# Patient Record
Sex: Male | Born: 1960 | Race: Black or African American | Hispanic: No | Marital: Single
Health system: Midwestern US, Community
[De-identification: ages and names within clinical notes are randomized; demographics above are authoritative.]

## PROBLEM LIST (undated history)

## (undated) DIAGNOSIS — I10 Essential (primary) hypertension: Secondary | ICD-10-CM

## (undated) DIAGNOSIS — E119 Type 2 diabetes mellitus without complications: Secondary | ICD-10-CM

## (undated) DIAGNOSIS — E7800 Pure hypercholesterolemia, unspecified: Secondary | ICD-10-CM

## (undated) DIAGNOSIS — J45909 Unspecified asthma, uncomplicated: Secondary | ICD-10-CM

---

## 2018-04-20 DIAGNOSIS — F329 Major depressive disorder, single episode, unspecified: Secondary | ICD-10-CM

## 2018-04-20 NOTE — Progress Notes (Signed)
 Pt awaiting discharge. Pt reported feeling frustrated and disappointed that in-patient care not available at this time.     Encouraged pt be diligent in returning for out-patient mental health care. Chaplain's affirming the challenges of making one's way in the current city scene appeared to be helpful to patient: he was abel to smile ruefully. Pt agreed with chaplain that he does have coping skills and functional capacities with which to dealt with life challenges.     Spiritual Care Assessment/Progress Notes    Claris Guymon [id number]  [id number]    1961-01-17  57 y.o.  male    Patient Telephone Number: [phone] (home)   Religious Affiliation: Sherlean   Language: Isadora   Extended Emergency Contact Information  Primary Emergency Contact: Hollon, robert  Address: [address] [place], [place] [address] [place]  Home Phone: [phone]  Relation: Brother   There are no active problems to display for this patient.       Date: 04/21/2018       Level of Religious/Spiritual Activity:  []          Involved in faith tradition/spiritual practice    []          Not involved in faith tradition/spiritual practice  []          Spiritually oriented    []          Claims no spiritual orientation    []          seeking spiritual identity  []          Feels alienated from religious practice/tradition  []          Feels angry about religious practice/tradition  [x]          Spirituality/religious tradition IS a Counsellor for coping at this time.  []          Not able to assess due to medical condition    Services Provided Today:  []          crisis intervention    []          reading Scriptures  []          spiritual assessment    []          prayer  [x]          empathic listening/emotional support  []          rites and rituals (cite in comments)  []          life review     [x]          religious support  []          theological development   []          advocacy  []          ethical dialog     []           blessing  []          bereavement support    []          support to family  []          anticipatory grief support   []          help with AMD  []          spiritual guidance    []          meditation      Spiritual Care Needs  [x]          Emotional Support  [  x]         Spiritual/Religious Care  []          Loss/Adjustment  []          Advocacy/Referral                /Ethics  []          No needs expressed at               this time  []          Other: (note in               comments)  Spiritual Care Plan  []          Follow up visits with               pt/family  []          Provide materials  []          Schedule sacraments  []          Contact Community               Clergy  [x]          Follow up as needed  []          Other: (note in               comments)     Comments:     Chaplain Jenkins Matilde Lemming, PhD  Spiritual Care Department  Phone: [phone]  Pager: [phone]

## 2018-04-20 NOTE — Progress Notes (Signed)
Aaron Estrada is a 57 yo male who called the ambo to bring him to the ED due to depression. He stated he has a history of depression that he was being treated for while incarcerated for 10 years. He was released released in August and has not received OPMH services. He has been prescribed prozac by his medical doctor since he was taking prozac while in prison. The patient stated he has been depressed 3-4 days because he is having a hard time adjusting to the outside since being released from prison. He is living with his cousin, unemployed, has a 8th grade education, no military history and has had no arrest in the past 30 days. He has had no drug abuse treatment but has a history of drug use. He used cocaine, a dime 2 days ago for the first time in 10 years.     On the MSE the patient assessed as calm, stated he has been depressed for 3-4 days. He had appropriate affect, he has not been sleeping for the past month, he has been up for the past 1.5 days. His energy level is down,he feels helpless, he was attentive, he stated he has been seeing shadows at the windows behind the blinds when he is watching television. The patient was alert and oriented, he had normal speech, cooperative behavior, he denied feeling suicidal or homicidal and he also denied a history of both. He has no access to weapons.    Axis 1.........Marland KitchenUnspecified Depressive Disorder..,..F32.9, Cocaine Abuse-Moderate...F14.20    Axis 2.Marland KitchenMarland KitchenMarland KitchenDeferred    Axis 3....Marland KitchenMarland KitchenNone Acute    Axis 4......Marland KitchenUnemployed, Social/Environmental Issues, Needed OPMH follow-up    Telephone call with Dr. Marcelle Overlie who recommended the patient be discharge to home and return on Thursday coming or the following Tuesday for [facility] by 8am at [facility]. The patient doesn't meet the criteria for inpatient services.

## 2018-04-20 NOTE — ED Provider Notes (Signed)
Aaron Estrada is a 57 y.o. male with a h/o depression who presents to the ED with complaint of worsening depression since he was released from [place] on 02/05/2018. He states that he was diagnosed with depression in jail and he has not seen a psychiatrist since his release. He feels that the reason for his depression worsening is because of social stressors but he is unwilling to discuss these stressors. He drinks occasionally and did recently use cocaine. He is not homeless.  He denies SI/HI and AH/VH.     The history is provided by the patient.   Depression    This is a chronic problem. The current episode started more than 1 week ago. The problem has been gradually worsening. Pertinent negatives include no weakness, no hallucinations and no numbness. His past medical history is significant for depression.        No past medical history on file.    No past surgical history on file.      No family history on file.    Social History     Socioeconomic History   ??? Marital status: SINGLE     Spouse name: Not on file   ??? Number of children: Not on file   ??? Years of education: Not on file   ??? Highest education level: Not on file   Occupational History   ??? Not on file   Social Needs   ??? Financial resource strain: Not on file   ??? Food insecurity:     Worry: Not on file     Inability: Not on file   ??? Transportation needs:     Medical: Not on file     Non-medical: Not on file   Tobacco Use   ??? Smoking status: Not on file   Substance and Sexual Activity   ??? Alcohol use: Not on file   ??? Drug use: Not on file   ??? Sexual activity: Not on file   Lifestyle   ??? Physical activity:     Days per week: Not on file     Minutes per session: Not on file   ??? Stress: Not on file   Relationships   ??? Social connections:     Talks on phone: Not on file     Gets together: Not on file     Attends religious service: Not on file     Active member of club or organization: Not on file     Attends meetings of clubs or organizations: Not on file      Relationship status: Not on file   ??? Intimate partner violence:     Fear of current or ex partner: Not on file     Emotionally abused: Not on file     Physically abused: Not on file     Forced sexual activity: Not on file   Other Topics Concern   ??? Not on file   Social History Narrative   ??? Not on file         ALLERGIES: Patient has no known allergies.    Review of Systems   Constitutional: Positive for fever ("few days ago"). Negative for appetite change.   HENT: Negative for rhinorrhea, sinus pain and sore throat.    Respiratory: Negative for cough and shortness of breath.    Cardiovascular: Negative for chest pain and leg swelling.   Gastrointestinal: Positive for diarrhea (1 day ago) and nausea (1 day ago). Negative for abdominal pain, constipation and vomiting.  Endocrine: Negative for polyuria.   Genitourinary: Negative for difficulty urinating, dysuria and flank pain.   Musculoskeletal: Positive for arthralgias, back pain (chronic) and joint swelling.        Chronic left knee pain   Skin: Negative for rash and wound.   Neurological: Positive for dizziness (transient this morning. Resolved ). Negative for syncope, weakness, numbness and headaches.   Psychiatric/Behavioral: Positive for depression and dysphoric mood. Negative for hallucinations, sleep disturbance and suicidal ideas.       Vitals:    04/20/18 1936   BP: (!) 163/101   Pulse: 97   Resp: 18   Temp: 98.1 ??F (36.7 ??C)   SpO2: 98%   Weight: 117.9 kg (260 lb)   Height: 6' (1.829 m)            Physical Exam   Constitutional: He is oriented to person, place, and time. He appears well-developed. No distress.   HENT:   Head: Normocephalic and atraumatic.   Mouth/Throat: Oropharynx is clear and moist.   Eyes: Conjunctivae and EOM are normal.   Neck: Normal range of motion. Neck supple.   Cardiovascular: Normal rate, regular rhythm, normal heart sounds and intact distal pulses. Exam reveals no gallop and no friction rub.   No murmur heard.  Pulmonary/Chest:  Effort normal and breath sounds normal. No stridor. No respiratory distress. He has no wheezes. He has no rales. He exhibits no tenderness.   Abdominal: Soft. He exhibits no distension and no mass. There is no tenderness. There is no rebound and no guarding.   Musculoskeletal: Normal range of motion. He exhibits no edema.   Neurological: He is alert and oriented to person, place, and time. No cranial nerve deficit.   Skin: Skin is warm and dry.   Nursing note and vitals reviewed.       MDM  Number of Diagnoses or Management Options  Depressive disorder:   Diagnosis management comments: This is a 57 y.o. male presenting with suicidal ideation. Testing will be done for medical conditions that may need stabilization prior to psychiatric evaluation or may be contributing to the presenting symptoms. Psychiatry will also be consulted.      Plan:   Orders Placed This Encounter      DRUG SCREEN, URINE      BREATHALIZER ETOH LEVEL      FLUoxetine (PROZAC) 40 mg capsule      CONSULT PSYCH SOCIAL WORKER  -----  9:15 PM  Documented by Enzo Montgomery, acting as a scribe for Dr. Janice Coffin, Victorino Dike.    PROVIDER ATTESTATION:  11:09 PM The entirety of this note, signed by me, accurately reflects all works, treatments, procedures, and medical decision making performed by me, Alessandra Bevels Reifel-Saltzberg, MD.      Patient Progress  Patient progress: stable    Diagnostic Studies:    Breathalyzer for EtOH: negative    Lab Data:  Labs Reviewed   DRUG SCREEN, URINE - Abnormal; Notable for the following components:       Result Value    COCAINE POSITIVE (*)     TRICYCLICS POSITIVE (*)     All other components within normal limits     ED Course:   10:01 PM  Spoke with the psych SW and the recommendation is for the patient to be discharged for F/U with outpatient mental health by Thursday or the following Tuesday at 8 AM.

## 2018-04-20 NOTE — ED Notes (Signed)
Breathalyzer performed at bedside for screening purposes only.    Result: 0.000

## 2018-04-20 NOTE — ED Notes (Signed)
 57 y/o male to ED via EMS for c/o depression.  Pt states he was incarcerated x10 years and recently released.  HE is having trouble transitioning and has recently had new life stressors.  Pt has not taken Prozac x4days I just forgot.

## 2018-04-20 NOTE — ED Provider Notes (Signed)
Aaron Estrada is a 57 y.o. male with a h/o depression who presents to the ED with complaint of worsening depression since he was released from [place] on 02/05/2018. He states that he was diagnosed with depression in jail and he has not seen a psychiatrist since his release. He feels that the reason for his depression worsening is because of social stressors but he is unwilling to discuss these stressors. He drinks occasionally and did recently use cocaine. He is not homeless.  He denies SI/HI and AH/VH.     The history is provided by the patient.   Depression    This is a chronic problem. The current episode started more than 1 week ago. The problem has been gradually worsening. Pertinent negatives include no weakness, no hallucinations and no numbness. His past medical history is significant for depression.        No past medical history on file.    No past surgical history on file.      No family history on file.    Social History     Socioeconomic History   ??? Marital status: SINGLE     Spouse name: Not on file   ??? Number of children: Not on file   ??? Years of education: Not on file   ??? Highest education level: Not on file   Occupational History   ??? Not on file   Social Needs   ??? Financial resource strain: Not on file   ??? Food insecurity:     Worry: Not on file     Inability: Not on file   ??? Transportation needs:     Medical: Not on file     Non-medical: Not on file   Tobacco Use   ??? Smoking status: Not on file   Substance and Sexual Activity   ??? Alcohol use: Not on file   ??? Drug use: Not on file   ??? Sexual activity: Not on file   Lifestyle   ??? Physical activity:     Days per week: Not on file     Minutes per session: Not on file   ??? Stress: Not on file   Relationships   ??? Social connections:     Talks on phone: Not on file     Gets together: Not on file     Attends religious service: Not on file     Active member of club or organization: Not on file     Attends meetings of clubs or organizations: Not on file      Relationship status: Not on file   ??? Intimate partner violence:     Fear of current or ex partner: Not on file     Emotionally abused: Not on file     Physically abused: Not on file     Forced sexual activity: Not on file   Other Topics Concern   ??? Not on file   Social History Narrative   ??? Not on file         ALLERGIES: Patient has no known allergies.    Review of Systems   Constitutional: Positive for fever ("few days ago"). Negative for appetite change.   HENT: Negative for rhinorrhea, sinus pain and sore throat.    Respiratory: Negative for cough and shortness of breath.    Cardiovascular: Negative for chest pain and leg swelling.   Gastrointestinal: Positive for diarrhea (1 day ago) and nausea (1 day ago). Negative for abdominal pain, constipation and vomiting.  Endocrine: Negative for polyuria.   Genitourinary: Negative for difficulty urinating, dysuria and flank pain.   Musculoskeletal: Positive for arthralgias, back pain (chronic) and joint swelling.        Chronic left knee pain   Skin: Negative for rash and wound.   Neurological: Positive for dizziness (transient this morning. Resolved ). Negative for syncope, weakness, numbness and headaches.   Psychiatric/Behavioral: Positive for depression and dysphoric mood. Negative for hallucinations, sleep disturbance and suicidal ideas.       Vitals:    04/20/18 1936   BP: (!) 163/101   Pulse: 97   Resp: 18   Temp: 98.1 ??F (36.7 ??C)   SpO2: 98%   Weight: 117.9 kg (260 lb)   Height: 6' (1.829 m)            Physical Exam   Constitutional: He is oriented to person, place, and time. He appears well-developed. No distress.   HENT:   Head: Normocephalic and atraumatic.   Mouth/Throat: Oropharynx is clear and moist.   Eyes: Conjunctivae and EOM are normal.   Neck: Normal range of motion. Neck supple.   Cardiovascular: Normal rate, regular rhythm, normal heart sounds and intact distal pulses. Exam reveals no gallop and no friction rub.   No murmur heard.   Pulmonary/Chest: Effort normal and breath sounds normal. No stridor. No respiratory distress. He has no wheezes. He has no rales. He exhibits no tenderness.   Abdominal: Soft. He exhibits no distension and no mass. There is no tenderness. There is no rebound and no guarding.   Musculoskeletal: Normal range of motion. He exhibits no edema.   Neurological: He is alert and oriented to person, place, and time. No cranial nerve deficit.   Skin: Skin is warm and dry.   Nursing note and vitals reviewed.       MDM  Number of Diagnoses or Management Options  Depressive disorder:   Diagnosis management comments: This is a 56 y.o. male presenting with suicidal ideation. Testing will be done for medical conditions that may need stabilization prior to psychiatric evaluation or may be contributing to the presenting symptoms. Psychiatry will also be consulted.      Plan:   Orders Placed This Encounter      DRUG SCREEN, URINE      BREATHALIZER ETOH LEVEL      FLUoxetine (PROZAC) 40 mg capsule      CONSULT PSYCH SOCIAL WORKER  -----  9:15 PM  Documented by Enzo Montgomery, acting as a scribe for Dr. Janice Coffin, Victorino Dike.    PROVIDER ATTESTATION:  11:09 PM The entirety of this note, signed by me, accurately reflects all works, treatments, procedures, and medical decision making performed by me, Alessandra Bevels Reifel-Saltzberg, MD.      Patient Progress  Patient progress: stable    Diagnostic Studies:    Breathalyzer for EtOH: negative    Lab Data:  Labs Reviewed   DRUG SCREEN, URINE - Abnormal; Notable for the following components:       Result Value    COCAINE POSITIVE (*)     TRICYCLICS POSITIVE (*)     All other components within normal limits     ED Course:   10:01 PM  Spoke with the psych SW and the recommendation is for the patient to be discharged for F/U with outpatient mental health by Thursday or the following Tuesday at 8 AM.

## 2018-04-20 NOTE — Progress Notes (Signed)
Pt awaiting discharge. Pt reported feeling frustrated and disappointed that in-patient care not available at this time.     Encouraged pt be diligent in returning for out-patient mental health care. Chaplain's affirming the challenges of making one's way in the current city scene appeared to be helpful to patient: he was abel to smile ruefully. Pt agreed with chaplain that he does have coping skills and functional capacities with which to dealt with life challenges.     Spiritual Care Assessment/Progress Notes    Aaron Estrada [id number]  [id number]    1960-08-08  57 y.o.  male    Patient Telephone Number: [phone] (home)   Religious Affiliation: Ephriam Knuckles   Language: Len[place] Ponds   Extended Emergency Contact Information  Primary Emergency Contact: Bjorkman, robert  Address: [address], [place] [address] [place]  Home Phone: [phone]  Relation: Brother   There are no active problems to display for this patient.       Date: 04/21/2018       Level of Religious/Spiritual Activity:  []          Involved in faith tradition/spiritual practice    []          Not involved in faith tradition/spiritual practice  []          Spiritually oriented    []          Claims no spiritual orientation    []          seeking spiritual identity  []          Feels alienated from religious practice/tradition  []          Feels angry about religious practice/tradition  [x]          Spirituality/religious tradition IS a Counsellor for coping at this time.  []          Not able to assess due to medical condition    Services Provided Today:  []          crisis intervention    []          reading Scriptures  []          spiritual assessment    []          prayer  [x]          empathic listening/emotional support  []          rites and rituals (cite in comments)  []          life review     [x]          religious support  []          theological development   []          advocacy   []          ethical dialog     []          blessing  []          bereavement support    []          support to family  []          anticipatory grief support   []          help with AMD  []          spiritual guidance    []          meditation      Spiritual Care Needs  [x]          Emotional Support  [  x]         Spiritual/Religious Care  []          Loss/Adjustment  []          Advocacy/Referral                /Ethics  []          No needs expressed at               this time  []          Other: (note in               comments)  Spiritual Care Plan  []          Follow up visits with               pt/family  []          Provide materials  []          Schedule sacraments  []          Contact Community               Clergy  [x]          Follow up as needed  []          Other: (note in               comments)     Comments:     Chaplain Laveda NormanAnn Riggs, MDiv, PhD  Spiritual Care Department  Phone: [phone]   Pager: [phone]

## 2018-04-20 NOTE — Progress Notes (Addendum)
Aaron Estrada is a 56 yo male who called the ambo to bring him to the ED due to depression. He stated he has a history of depression that he was being treated for while incarcerated for 10 years. He was released released in August and has not received OPMH services. He has been prescribed prozac by his medical doctor since he was taking prozac while in prison. The patient stated he has been depressed 3-4 days because he is having a hard time adjusting to the outside since being released from prison. He is living with his cousin, unemployed, has a 8th grade education, no military history and has had no arrest in the past 30 days. He has had no drug abuse treatment but has a history of drug use. He used cocaine, a dime 2 days ago for the first time in 10 years.     On the MSE the patient assessed as calm, stated he has been depressed for 3-4 days. He had appropriate affect, he has not been sleeping for the past month, he has been up for the past 1.5 days. His energy level is down,he feels helpless, he was attentive, he stated he has been seeing shadows at the windows behind the blinds when he is watching television. The patient was alert and oriented, he had normal speech, cooperative behavior, he denied feeling suicidal or homicidal and he also denied a history of both. He has no access to weapons.    Axis 1..........Unspecified Depressive Disorder..,..F32.9, Cocaine Abuse-Moderate...F14.20    Axis 2....Deferred    Axis 3......None Acute    Axis 4.......Unemployed, Social/Environmental Issues, Needed OPMH follow-up    Telephone call with Dr. Koola who recommended the patient be discharge to home and return on Thursday coming or the following Tuesday for [facility] by 8am at [facility]. The patient doesn't meet the criteria for inpatient services.

## 2018-04-20 NOTE — ED Triage Notes (Signed)
56 y/o male to ED via EMS for c/o depression.  Pt states he was incarcerated x10 years and recently released.  HE is having trouble transitioning and has recently had new life stressors.  Pt has not taken Prozac x4days "I just forgot".

## 2018-04-21 ENCOUNTER — Inpatient Hospital Stay
Admit: 2018-04-21 | Discharge: 2018-04-21 | Disposition: A | Discharge status: Home / Self Care | Payer: MEDICAID | Attending: Emergency Medicine

## 2018-04-21 LAB — DRUG SCREEN, URINE
AMPHETAMINES: NEGATIVE
Amphetamine Screen, Urine: NEGATIVE
BARBITURATES: NEGATIVE
BENZODIAZEPINES: NEGATIVE
Barbiturate Screen, Urine: NEGATIVE
Benzodiazepine Screen, Urine: NEGATIVE
Buprenorphine Screen, Urine: NEGATIVE
Buprenorphine screen, urine: NEGATIVE
COCAINE: POSITIVE — AB
Cocaine Screen Urine: POSITIVE — AB
METHADONE: NEGATIVE
Methadone Screen, Urine: NEGATIVE
Methamphetamines: NEGATIVE
Methamphetamines: NEGATIVE
OPIATES: NEGATIVE
OXYCODONE SCREEN: NEGATIVE
Opiate Screen, Urine: NEGATIVE
Oxycodone Screen: NEGATIVE
PCP Screen, Urine: NEGATIVE
PCP(PHENCYCLIDINE): NEGATIVE
PROPOXYPHENE,PPX: NEGATIVE
PROPOXYPHENE: NEGATIVE
THC (TH-CANNABINOL): NEGATIVE
THC Screen, Urine: NEGATIVE
TRICYCLICS,TCAT: POSITIVE — AB
TRICYCLICS: POSITIVE — AB

## 2018-04-21 MED ORDER — IBUPROFEN 600 MG TAB
600 mg | ORAL | Status: DC
Start: 2018-04-21 — End: 2018-04-21

## 2018-04-21 MED FILL — IBUPROFEN 600 MG TAB: 600 mg | ORAL | Qty: 1

## 2018-04-22 ENCOUNTER — Inpatient Hospital Stay: Admit: 2018-04-22 | Payer: MEDICAID | Primary: Family Medicine

## 2018-04-22 DIAGNOSIS — F323 Major depressive disorder, single episode, severe with psychotic features: Secondary | ICD-10-CM

## 2018-04-22 LAB — DRUG SCREEN, URINE
AMPHETAMINES: NEGATIVE
Amphetamine Screen, Urine: NEGATIVE
BARBITURATES: NEGATIVE
BENZODIAZEPINES: NEGATIVE
Barbiturate Screen, Urine: NEGATIVE
Benzodiazepine Screen, Urine: NEGATIVE
Buprenorphine Screen, Urine: NEGATIVE
Buprenorphine screen, urine: NEGATIVE
COCAINE: POSITIVE — AB
Cocaine Screen Urine: POSITIVE — AB
METHADONE: NEGATIVE
Methadone Screen, Urine: NEGATIVE
Methamphetamines: NEGATIVE
Methamphetamines: NEGATIVE
OPIATES: NEGATIVE
OXYCODONE SCREEN: NEGATIVE
Opiate Screen, Urine: NEGATIVE
Oxycodone Screen: NEGATIVE
PCP Screen, Urine: NEGATIVE
PCP(PHENCYCLIDINE): NEGATIVE
PROPOXYPHENE,PPX: NEGATIVE
PROPOXYPHENE: NEGATIVE
THC (TH-CANNABINOL): NEGATIVE
THC Screen, Urine: NEGATIVE
TRICYCLICS,TCAT: POSITIVE — AB
TRICYCLICS: POSITIVE — AB

## 2018-04-22 LAB — COMPREHENSIVE METABOLIC PANEL
ALT: 48 U/L (ref 12–78)
AST: 26 U/L (ref 15–37)
Albumin/Globulin Ratio: 1.1 (ref 1.0–3.1)
Albumin: 4 g/dL (ref 3.4–5.0)
Alkaline Phosphatase: 78 U/L (ref 46–116)
Anion Gap: 14 mmol/L (ref 10–17)
BUN: 15 MG/DL (ref 7–18)
Bun/Cre Ratio: 14 (ref 6.0–20.0)
CO2: 27 mmol/L (ref 21–32)
Calcium: 8.9 MG/DL (ref 8.5–10.1)
Chloride: 104 mmol/L (ref 98–107)
Creatinine: 1.1 MG/DL (ref 0.6–1.3)
EGFR IF NonAfrican American: >60 mL/min/{1.73_m2} (ref >60)
GFR African American: >60 mL/min/{1.73_m2} (ref >60)
Globulin: 3.5 g/dL
Glucose: 91 mg/dL (ref 74–106)
Potassium: 4.1 mmol/L (ref 3.5–5.1)
Sodium: 141 mmol/L (ref 136–145)
Total Bilirubin: 0.9 MG/DL (ref 0.2–1.0)
Total Protein: 7.5 g/dL (ref 6.4–8.2)

## 2018-04-22 LAB — CBC WITH AUTO DIFFERENTIAL
Basophils %: 0 % (ref 0–2)
Basophils Absolute: 0 10*3/uL (ref 0.0–0.2)
Eosinophils %: 2 % (ref 0–5)
Eosinophils Absolute: 0.1 10*3/uL (ref 0.0–0.7)
Hematocrit: 43.2 % (ref 36.8–45.2)
Hemoglobin: 14.2 g/dL (ref 12.8–15.0)
Immature Granulocytes: 0 % (ref 0.0–5.0)
Lymphocytes %: 50 % — ABNORMAL HIGH (ref 16–40)
Lymphocytes Absolute: 2.3 10*3/uL (ref 1.2–3.4)
MCH: 29.2 PG (ref 27–31)
MCHC: 32.9 g/dL (ref 32–36)
MCV: 88.7 FL (ref 81–99)
MPV: 10 FL (ref 7.4–10.4)
Monocytes %: 9 % (ref 0–12)
Monocytes Absolute: 0.4 10*3/uL — ABNORMAL LOW (ref 1.1–3.2)
Neutrophils %: 39 % — ABNORMAL LOW (ref 40–70)
Neutrophils Absolute: 1.8 10*3/uL (ref 1.4–6.5)
Platelets: 206 10*3/uL (ref 140–450)
RBC: 4.87 M/uL (ref 4.0–5.2)
RDW: 15.2 % — ABNORMAL HIGH (ref 11.5–14.5)
WBC: 4.7 10*3/uL — ABNORMAL LOW (ref 4.8–10.8)

## 2018-04-22 LAB — LIPID PANEL
CHOL/HDL Ratio: 4.4
Chol/HDL Ratio: 4.4
Cholesterol, Total: 250 MG/DL — ABNORMAL HIGH (ref <200)
Cholesterol, total: 250 MG/DL — ABNORMAL HIGH (ref <200)
HDL Cholesterol: 57 MG/DL (ref 40–60)
HDL: 57 MG/DL (ref 40–60)
LDL Calculated: 178 MG/DL — ABNORMAL HIGH (ref 0–130)
LDL, calculated: 178 MG/DL — ABNORMAL HIGH (ref 0–130)
LDL/HDL Ratio: 3.1
LDL/HDL Ratio: 3.1
Triglyceride: 75 MG/DL (ref 0–200)
Triglycerides: 75 MG/DL (ref 0–200)
VLDL Cholesterol Calculated: 15 MG/DL
VLDL, calculated: 15 MG/DL

## 2018-04-22 LAB — HEMOGLOBIN A1C W/O EAG
Hemoglobin A1C: 5.5 % (ref 4.5–6.2)
Hemoglobin A1c: 5.5 % (ref 4.5–6.2)

## 2018-04-22 LAB — TSH 3RD GENERATION
TSH: 1.31 u[IU]/mL (ref 0.55–7.7)
TSH: 1.31 u[IU]/mL (ref 0.55–7.7)

## 2018-04-22 LAB — HEPATITIS C ANTIBODY: HCV Ab: NONREACTIVE

## 2018-04-22 LAB — ETHYL ALCOHOL
ALCOHOL(ETHYL),SERUM: <3 MG/DL (ref <3.0)
Ethyl Alcohol: <3 MG/DL (ref <3.0)

## 2018-04-22 LAB — CBC WITH AUTOMATED DIFF
ABS. BASOPHILS: 0 10*3/uL (ref 0.0–0.2)
ABS. EOSINOPHILS: 0.1 10*3/uL (ref 0.0–0.7)
ABS. LYMPHOCYTES: 2.3 10*3/uL (ref 1.2–3.4)
ABS. MONOCYTES: 0.4 10*3/uL — ABNORMAL LOW (ref 1.1–3.2)
ABS. NEUTROPHILS: 1.8 10*3/uL (ref 1.4–6.5)
BASOPHILS: 0 % (ref 0–2)
EOSINOPHILS: 2 % (ref 0–5)
HCT: 43.2 % (ref 36.8–45.2)
HGB: 14.2 g/dL (ref 12.8–15.0)
IMMATURE GRANULOCYTES: 0 % (ref 0.0–5.0)
LYMPHOCYTES: 50 % — ABNORMAL HIGH (ref 16–40)
MCH: 29.2 PG (ref 27–31)
MCHC: 32.9 g/dL (ref 32–36)
MCV: 88.7 FL (ref 81–99)
MONOCYTES: 9 % (ref 0–12)
MPV: 10 FL (ref 7.4–10.4)
NEUTROPHILS: 39 % — ABNORMAL LOW (ref 40–70)
PLATELET: 206 10*3/uL (ref 140–450)
RBC: 4.87 M/uL (ref 4.0–5.2)
RDW: 15.2 % — ABNORMAL HIGH (ref 11.5–14.5)
WBC: 4.7 10*3/uL — ABNORMAL LOW (ref 4.8–10.8)

## 2018-04-22 LAB — METABOLIC PANEL, COMPREHENSIVE
A-G Ratio: 1.1 (ref 1.0–3.1)
ALT (SGPT): 48 U/L (ref 12–78)
AST (SGOT): 26 U/L (ref 15–37)
Albumin: 4 g/dL (ref 3.4–5.0)
Alk. phosphatase: 78 U/L (ref 46–116)
Anion gap: 14 mmol/L (ref 10–17)
BUN/Creatinine ratio: 14 (ref 6.0–20.0)
BUN: 15 MG/DL (ref 7–18)
Bilirubin, total: 0.9 MG/DL (ref 0.2–1.0)
CO2: 27 mmol/L (ref 21–32)
Calcium: 8.9 MG/DL (ref 8.5–10.1)
Chloride: 104 mmol/L (ref 98–107)
Creatinine: 1.1 MG/DL (ref 0.6–1.3)
GFR est AA: >60 mL/min/{1.73_m2} (ref >60)
GFR est non-AA: >60 mL/min/{1.73_m2} (ref >60)
Globulin: 3.5 g/dL
Glucose: 91 mg/dL (ref 74–106)
Potassium: 4.1 mmol/L (ref 3.5–5.1)
Protein, total: 7.5 g/dL (ref 6.4–8.2)
Sodium: 141 mmol/L (ref 136–145)

## 2018-04-22 LAB — HCV AB: Hepatitis C virus Ab: NONREACTIVE
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
## 2018-04-27 LAB — 15-DRUG SCREEN WITH BENZO,UR,W/CONF.
6-Acetylmorphine, urine: NEGATIVE ng/mL
Alprazolam: NEGATIVE
Amphetamine Screen, urine: NEGATIVE ng/mL
Barbiturates Screen, urine: NEGATIVE ng/mL
Benzodiazepines: NEGATIVE ng/mL
Buprenorphine, urine: NEGATIVE ng/mL
Cannabinoid Screen, urine: NEGATIVE ng/mL
Clonazepam: NEGATIVE
Creatinine, urine: 260.6 mg/dL (ref 20.0–300.0)
Ethanol: NEGATIVE mg/dL
Fentanyl Screen, urine: NEGATIVE pg/mL
Flurazepam: NEGATIVE
Lorazepam: NEGATIVE
Meperidine Screen, urine: NEGATIVE ng/mL
Methadone Screen, urine: NEGATIVE ng/mL
Midazolam: NEGATIVE
Nordiazepam: NEGATIVE
Opiate Screen, urine: NEGATIVE ng/mL
Oxazepam: NEGATIVE
Oxycodone/Oxymorphone, urine: NEGATIVE ng/mL
Phencyclidine Screen, urine: NEGATIVE ng/mL
Propoxyphene Screen, urine: NEGATIVE ng/mL
Specific Gravity: 1.019
Temazepam: NEGATIVE
Tramadol Screen, urine: NEGATIVE ng/mL
Triazolam: NEGATIVE
pH, urine: 5.9 (ref 4.5–8.9)

## 2018-04-27 LAB — COCAINE, CONFIRM
Cocaine + Metabolite: POSITIVE — AB
Cocaine confirm: 7400 ng/mL

## 2018-04-30 ENCOUNTER — Encounter: Primary: Family Medicine

## 2018-04-30 ENCOUNTER — Encounter: Attending: Psychiatry | Primary: Family Medicine

## 2018-05-03 ENCOUNTER — Inpatient Hospital Stay: Admit: 2018-05-03 | Payer: MEDICAID | Attending: Psychiatry | Primary: Family Medicine

## 2018-05-03 ENCOUNTER — Inpatient Hospital Stay: Admit: 2018-05-03 | Payer: MEDICAID | Primary: Family Medicine

## 2018-05-03 MED ORDER — FLUOXETINE 40 MG CAP
40 mg | ORAL_CAPSULE | Freq: Every day | ORAL | 1 refills | Status: DC
Start: 2018-05-03 — End: 2018-05-31
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
## 2018-05-03 NOTE — Behavioral Health Treatment Team (Signed)
 [facility]   Nutritional Screening Form    Aaron Estrada  12-04-1960  [id number]    The purpose of a nutritional screen is to identify nutritional problems and risk factors.  Utilizing the below screening assessment questionnaire will determine the need for a nutrition assessment by a registered dietitian.    Evaluation Criteria (check all that apply) Yes No   Weight gain or loss due to psychiatric medication?  No   Miss meals due to financial limitations?  No   Unintended weight loss of more than 10 pounds within the last three months? Yes    Consuming less than 50% of meals for 5 days or more?  No   Increase or decrease in appetite/ food intake for 4-6 weeks? Yes    Eating habits or behaviors that may be indicators of an eating disorder, such as binging or induced vomiting  No   Diarrhea for more than 2 days?  No   Vomiting 5 days or more?  No   Malnourished appearance?  No   Morbid obesity (at or greater than 42 BMI)?  No   Chrohn's or Ulcerative Colitis?  No   GERD?  No   HIV/AIDS  No   Diabetes  No   Food allergies?  No   Dental problem?  No   Special diet?  No   Nutrition Assessment                     No  Client identified to be at nutritional risk based on meeting > 4 criteria above       ___   ___     Comments: patient has an intentional weight loss of > 30 lbs. I am trying to eat better. Patient also c/o intermittent constipation.      Referral to (Name of Facility):  Referral made by:
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
## 2018-05-03 NOTE — Behavioral Health Treatment Team (Signed)
[facility]   Behavioral Health   Nutritional Screening Form    Aaron Estrada  03/08/1961  [id number]    The purpose of a nutritional screen is to identify nutritional problems and risk factors.  Utilizing the below screening assessment questionnaire will determine the need for a nutrition assessment by a registered dietitian.    Evaluation Criteria (check all that apply) Yes No   Weight gain or loss due to psychiatric medication?  No   Miss meals due to financial limitations?  No   Unintended weight loss of more than 10 pounds within the last three months? Yes    Consuming less than 50% of meals for 5 days or more?  No   Increase or decrease in appetite/ food intake for 4-6 weeks? Yes    Eating habits or behaviors that may be indicators of an eating disorder, such as binging or induced vomiting  No   Diarrhea for more than 2 days?  No   Vomiting 5 days or more?  No   Malnourished appearance?  No   Morbid obesity (at or greater than 42 BMI)?  No   Chrohn???s or Ulcerative Colitis?  No   GERD?  No   HIV/AIDS  No   Diabetes  No   Food allergies?  No   Dental problem?  No   Special diet?  No   Nutrition Assessment                     No  Client identified to be at nutritional risk based on meeting > 4 criteria above       ___   ___     Comments: patient has an intentional weight loss of > 30 lbs. "I am trying to eat better." Patient also c/o intermittent constipation.      Referral to (Name of Facility):  Referral made by:
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
## 2018-05-07 ENCOUNTER — Inpatient Hospital Stay: Admit: 2018-05-07 | Payer: MEDICAID | Primary: Family Medicine

## 2018-05-07 NOTE — Behavioral Health Treatment Team (Signed)
 [facility]   Department of [place] Health    Progress Note    Aaron Estrada  05-23-1961  [id number]    DOS: 05/07/18    Time In 1:05pm  Time Out 1:30pm  Length of Service: 25 minutes    Charge Code:  09167  Psychotherapy 30 min  ([id number])    Program:  [facility]    Presenting Problems of Chief Complaint (Including onging, recurrent problems and active complaint for this day):  Pt reported no problems.    Mental Status Examination    General Appearance: Appropriately attired  Orientation to:  Person, Place, Time and Situation  Consciousness:  Clear  General Behavior: Cooperative  Motor Activity:  Normal motor activity  Content of Thought: Unremarkable  Speech Process: Normal  Thought Process: Logical  Mood (emotional state as subjectively described by client): Happy  Affect (emotional tone observed and defined through observation by the interviewer):  Euthymic, congruent  Insight and Judgment:  Good insight and Good judgment    Symptoms and General Findings (Self-report and clinical observations to include changes compared to previous sessions):  Pt reported that he decided to get Tx for SUD. He attends and receives housing through [facility]. He stated that he has been taking psychotropic medication every day. He denied any side effects.    Interventions and Patient's Responses (Include homework): This therapist reviewed psychiatric evaluation with pt. Pt was commended for seeking Tx for SUD.    DSM-5 Diagnosis: F34.1 Persistent Depressive Disorder    Treatment Goals Progress: ITP in progress    Prognosis: Fair    Progress Note Plan:    Modality: Individual Therapy     Frequency: Monthly    Next Appointment: 05/28/18    [name] [facility]  05/11/2018  1:05 PM

## 2018-05-07 NOTE — Behavioral Health Treatment Team (Signed)
[facility]   Department of Behavioral Health    Progress Note    Emanuel Garrelts  03/06/1961  [id number]    DOS: 05/07/18    Time In 1:05pm  Time Out 1:30pm  Length of Service: 25 minutes    Charge Code:  90832  Psychotherapy 30 min  ([id number])    Program:  [facility]    Presenting Problems of Chief Complaint (Including onging, recurrent problems and active complaint for this day):  Pt reported no problems.    Mental Status Examination    General Appearance: Appropriately attired  Orientation to:  Person, Place, Time and Situation  Consciousness:  Clear  General Behavior: Cooperative  Motor Activity:  Normal motor activity  Content of Thought: Unremarkable  Speech Process: Normal  Thought Process: Logical  Mood (emotional state as subjectively described by client): "Happy"  Affect (emotional tone observed and defined through observation by the interviewer):  Euthymic, congruent  Insight and Judgment:  Good insight and Good judgment    Symptoms and General Findings (Self-report and clinical observations to include changes compared to previous sessions):  Pt reported that he decided to get Tx for SUD. He attends and receives housing through [facility]. He stated that he has been taking psychotropic medication every day. He denied any side effects.    Interventions and Patient's Responses (Include homework): This therapist reviewed psychiatric evaluation with pt. Pt was commended for seeking Tx for SUD.    DSM-5 Diagnosis: F34.1 Persistent Depressive Disorder    Treatment Goals Progress: ITP in progress    Prognosis: Fair    Progress Note Plan:    Modality: Individual Therapy     Frequency: Monthly    Next Appointment: 05/28/18    Anays Detore LCPC  05/11/2018  1:05 PM

## 2018-05-28 NOTE — Behavioral Health Treatment Team (Signed)
Missed Appointment Note        05/28/2018      Aaron Estrada  November 30, 1960  [id number]    Service:  [facility]  Missed appointment type:  No show  Reason: Unknown   Outcome: A message was unable to be left for the patient to reschedule the missed appointment due to the patient's phone not functioning/voicemail box being full.        Alford Highland [facility]  05/28/2018

## 2018-05-28 NOTE — Behavioral Health Treatment Team (Signed)
Missed Appointment Note        05/28/2018      Aaron Estrada  08/23/1960  [id number]    Service:  [facility]  Missed appointment type:  No show  Reason: Unknown   Outcome: A message was unable to be left for the patient to reschedule the missed appointment due to the patient's phone not functioning/voicemail box being full.        Dahlila Pfahler LCPC  05/28/2018

## 2018-05-29 ENCOUNTER — Inpatient Hospital Stay: Payer: MEDICAID | Primary: Family Medicine

## 2018-05-31 ENCOUNTER — Inpatient Hospital Stay: Admit: 2018-05-31 | Payer: MEDICAID | Attending: Psychiatry | Primary: Family Medicine

## 2018-05-31 MED ORDER — FLUOXETINE 40 MG CAP
40 mg | ORAL_CAPSULE | Freq: Every day | ORAL | 1 refills | Status: AC
Start: 2018-05-31 — End: ?

## 2018-05-31 NOTE — Progress Notes (Signed)
[facility]  [place]     [place] Note    Name: Aaron Estrada    Account Number:  [id number]   Date: 05/31/2018     Time: 12:39 PM     Session Information   Time In: 1230   Time Out: 1300   Outpatient    DSM 5 Diagnosis     Patient Active Problem List    Diagnosis Date Noted   ??? HTN (hypertension) 05/03/2018   ??? Knee pain 05/03/2018   ??? HLD (hyperlipidemia) 05/03/2018   ??? Persistent depressive disorder 05/03/2018   ??? H/O: substance abuse (HCC) 05/03/2018   ??? Tobacco use disorder 05/03/2018       Dr Benay Pillowontact Information    History of Present Illness: 57yAAM, divorced in 1992, no kids, living in a recovery house for 6 months (then returns home with his cousin), supported by [employer].   -[facility].   -He is on probation until 2023  ??  Pt is on time for his appt.   Pt missed his last therapy appt while at his program, but he'll rescheduled.   He's waiting on his insurance card to see his PCP regarding his knee pain, has appt for Dec 26th.   He's doing fine on Prozac, no adverse effects.     Plan: Cont Prozac 40mg /day for depression anxiety; Cont drug tx program; Cont therapy. Avoid Cocaine and Etoh. Recommend not picking up Tobacco again.    ??  ??  PCP: [facility]     Chief Complaint: f/u     Compliance with Medication: Yes     Pregnant: Not applicable    Mental Status   Reliability in Providing Information: Good  Personal Presentation: Dresses appropriately (beard, glassesm, kufi)  Motor Activity: Unremarkable  Speech Pattern: Normal rate;Normal rhythm  General Attitude: Cooperative  Mood: Euthymic  Eye Contact: Good  Affect: Full  Thought Process: Organized  Thought Content: Unremarkable  Hallucinations: None  Delusions: None  Suicidal Ideation/Attempts: No  Homicidal Ideation/Attempts: No  Orientation Level: Oriented x4  Homicidal Ideation/Attempts: No  Neurologic State: Alert  Attention/Concentration: Attentive  Abstract  Thinking: Intact  Judgment: Good  Insight: Good  Risks: Diminished Function  Strengths and Assets Inventory: Adequate living arrangements;Family Support        Medications      Prior to Admission medications    Medication Sig Start Date End Date Taking? Authorizing Provider   FLUoxetine (PROZAC) 40 mg capsule Take 1 Cap by mouth daily. Indications: Anxiousness associated with Depression 05/31/18  Yes Joette CatchingHauser, David C, MD   aspirin 81 mg chewable tablet Take 81 mg by mouth daily.    Provider, Historical         Med Rec Review   Medication reconciliation previously reviewed. No changes.    Labs   All lab results reviewed.    AIMS  No data recorded    Plan   Treatment Plan        1. Continue current treatment modalities? If no, state rationale and address changes in Treatment Plan under Optional Sections.  Yes        2. Continue current medications? If no, state rationale and address changes in Medications under Optional Sections.  Yes        3. Referrals or Consultations needed?  Specify below and state reason. No        Risk Management  Risk Factors    Suicide: Not Evident    Physical Violence: Not Evident                  Signed By: Joette Catchingavid C Briseyda Fehr, MD  05/31/2018

## 2018-05-31 NOTE — Progress Notes (Signed)
[facility]  Department of [place] Health     Progress Note    Name: Aaron Estrada    Account Number:  [id number]   Date: 05/31/2018     Time: 12:39 PM     Session Information   Time In: 1230   Time Out: 1300   Outpatient    DSM 5 Diagnosis     Patient Active Problem List    Diagnosis Date Noted   ??? HTN (hypertension) 05/03/2018   ??? Knee pain 05/03/2018   ??? HLD (hyperlipidemia) 05/03/2018   ??? Persistent depressive disorder 05/03/2018   ??? H/O: substance abuse (HCC) 05/03/2018   ??? Tobacco use disorder 05/03/2018       Dr Benay Pillow Information    History of Present Illness: 57yAAM, divorced in 1992, no kids, living in a recovery house for 6 months (then returns home with his cousin), supported by [employer].   -Pt attends [facility] drug tx program.   -He is on probation until 2023  ??  Pt is on time for his appt.   Pt missed his last therapy appt while at his program, but he'll rescheduled.   He's waiting on his insurance card to see his PCP regarding his knee pain, has appt for Dec 26th.   He's doing fine on Prozac, no adverse effects.     Plan: Cont Prozac 40mg /day for depression anxiety; Cont drug tx program; Cont therapy. Avoid Cocaine and Etoh. Recommend not picking up Tobacco again.    ??  ??  PCP: [facility]     Chief Complaint: f/u     Compliance with Medication: Yes     Pregnant: Not applicable    Mental Status   Reliability in Providing Information: Good  Personal Presentation: Dresses appropriately (beard, glassesm, kufi)  Motor Activity: Unremarkable  Speech Pattern: Normal rate;Normal rhythm  General Attitude: Cooperative  Mood: Euthymic  Eye Contact: Good  Affect: Full  Thought Process: Organized  Thought Content: Unremarkable  Hallucinations: None  Delusions: None  Suicidal Ideation/Attempts: No  Homicidal Ideation/Attempts: No  Orientation Level: Oriented x4  Homicidal Ideation/Attempts: No  Neurologic State: Alert  Attention/Concentration: Attentive   Abstract Thinking: Intact  Judgment: Good  Insight: Good  Risks: Diminished Function  Strengths and Assets Inventory: Adequate living arrangements;Family Support        Medications      Prior to Admission medications    Medication Sig Start Date End Date Taking? Authorizing Provider   FLUoxetine (PROZAC) 40 mg capsule Take 1 Cap by mouth daily. Indications: Anxiousness associated with Depression 05/31/18  Yes Joette Catching, MD   aspirin 81 mg chewable tablet Take 81 mg by mouth daily.    Provider, Historical         Med Rec Review   Medication reconciliation previously reviewed. No changes.    Labs   All lab results reviewed.    AIMS  No data recorded    Plan   Treatment Plan        1. Continue current treatment modalities? If no, state rationale and address changes in Treatment Plan under Optional Sections.  Yes        2. Continue current medications? If no, state rationale and address changes in Medications under Optional Sections.  Yes        3. Referrals or Consultations needed?  Specify below and state reason. No        Risk Management  Risk Factors    Suicide: Not Evident    Physical Violence: Not Evident                  Signed By: Joette Catchingavid C Briseyda Fehr, MD  05/31/2018

## 2018-07-01 NOTE — Behavioral Health Treatment Team (Signed)
Missed Appointment Note        07/01/2018      Aaron Estrada  04/12/1961  [id number]    Service:  [facility]  Missed appointment type:  No show  Reason: Unknown   Outcome: A message was unable to be left for the patient to reschedule the missed appointment due to the patient's phone not functioning/voicemail box being full.        Alford Highland [facility]  07/01/2018

## 2018-07-01 NOTE — Behavioral Health Treatment Team (Signed)
[facility]  Outpatient Mental Health Clinic  Discharge Summary    Aaron Estrada  1961/06/02    Date of admission: 04/22/18  Date of last visit: 05/31/18  Date of discharge: 11/19/18    Therapist: Alford Highland [facility]  Psychiatrist: Dr. Alben Deeds  Reason for admission and summary of client treatment issues: Hx of depression and recent hallucinations. Pt reported that he had been going through a lot since he has been home.  Admission Diagnosis: F34.1 Persistent Depressive D/O  Treatment Type (frequency): Individual Therapy  Monthly and Medication Management  Monthly    Response to Treatment: Non-compliant  Disposition (reason for discharge): Patient stopped attending without notifiying therapist  Further Recommendations: OP Mental Health Treatment  Aftercare Plan - Referrals and appointments: Resources included with discharge letter  Level of involvement of client in developing discharge plan: None  Last Medication Prescribed: Prozac 40mg   Discharge Diagnosis: F34.1 Persistent Depressive D/O  Prognosis: Fair       11/19/2018  3:20 PM  Alford Highland LCPC

## 2018-07-01 NOTE — Progress Notes (Signed)
Pt cancelled.

## 2018-07-01 NOTE — Behavioral Health Treatment Team (Signed)
Missed Appointment Note        07/01/2018      Aaron Estrada  06/04/1961  [id number]    Service:  [facility]  Missed appointment type:  No show  Reason: Unknown   Outcome: A message was unable to be left for the patient to reschedule the missed appointment due to the patient's phone not functioning/voicemail box being full.        Ginia Rudell LCPC  07/01/2018

## 2018-07-01 NOTE — Behavioral Health Treatment Team (Signed)
[facility]  Outpatient Mental Health Clinic  Discharge Summary    Aaron Estrada  06/29/1960    Date of admission: 04/22/18  Date of last visit: 05/31/18  Date of discharge: 11/19/18    Therapist: Kevontay Burks LCPC  Psychiatrist: Dr. Hauser  Reason for admission and summary of client treatment issues: Hx of depression and recent hallucinations. Pt reported that he had been going through a lot since he has been home.  Admission Diagnosis: F34.1 Persistent Depressive D/O  Treatment Type (frequency): Individual Therapy  Monthly and Medication Management  Monthly    Response to Treatment: Non-compliant  Disposition (reason for discharge): Patient stopped attending without notifiying therapist  Further Recommendations: OP Mental Health Treatment  Aftercare Plan - Referrals and appointments: Resources included with discharge letter  Level of involvement of client in developing discharge plan: None  Last Medication Prescribed: Prozac 40mg  Discharge Diagnosis: F34.1 Persistent Depressive D/O  Prognosis: Fair       11/19/2018  3:20 PM  Aaron Estrada LCPC

## 2018-07-02 ENCOUNTER — Inpatient Hospital Stay: Payer: MEDICAID | Attending: Psychiatry | Primary: Family Medicine

## 2018-07-02 ENCOUNTER — Inpatient Hospital Stay: Payer: MEDICAID | Primary: Family Medicine

## 2019-05-19 ENCOUNTER — Ambulatory Visit: Payer: MEDICAID | Primary: Family Medicine

## 2021-01-23 ENCOUNTER — Encounter (HOSPITAL_COMMUNITY): Payer: Self-pay | Admitting: Emergency Medicine

## 2021-01-23 ENCOUNTER — Emergency Department (HOSPITAL_COMMUNITY)
Admission: EM | Admit: 2021-01-23 | Discharge: 2021-01-24 | Disposition: A | Discharge status: Home / Self Care | Payer: Medicaid - Out of State | Attending: Emergency Medicine | Admitting: Emergency Medicine

## 2021-01-23 ENCOUNTER — Emergency Department (HOSPITAL_COMMUNITY): Payer: Medicaid - Out of State

## 2021-01-23 ENCOUNTER — Other Ambulatory Visit: Payer: Self-pay

## 2021-01-23 DIAGNOSIS — J45909 Unspecified asthma, uncomplicated: Secondary | ICD-10-CM | POA: Diagnosis not present

## 2021-01-23 DIAGNOSIS — R079 Chest pain, unspecified: Secondary | ICD-10-CM | POA: Diagnosis not present

## 2021-01-23 DIAGNOSIS — S5011XA Contusion of right forearm, initial encounter: Secondary | ICD-10-CM | POA: Insufficient documentation

## 2021-01-23 DIAGNOSIS — R7401 Elevation of levels of liver transaminase levels: Secondary | ICD-10-CM

## 2021-01-23 DIAGNOSIS — S40012A Contusion of left shoulder, initial encounter: Secondary | ICD-10-CM | POA: Insufficient documentation

## 2021-01-23 DIAGNOSIS — I10 Essential (primary) hypertension: Secondary | ICD-10-CM | POA: Insufficient documentation

## 2021-01-23 DIAGNOSIS — Y92410 Unspecified street and highway as the place of occurrence of the external cause: Secondary | ICD-10-CM | POA: Insufficient documentation

## 2021-01-23 DIAGNOSIS — S8002XA Contusion of left knee, initial encounter: Secondary | ICD-10-CM | POA: Insufficient documentation

## 2021-01-23 DIAGNOSIS — R220 Localized swelling, mass and lump, head: Secondary | ICD-10-CM | POA: Insufficient documentation

## 2021-01-23 DIAGNOSIS — S40021A Contusion of right upper arm, initial encounter: Secondary | ICD-10-CM

## 2021-01-23 DIAGNOSIS — E119 Type 2 diabetes mellitus without complications: Secondary | ICD-10-CM | POA: Diagnosis not present

## 2021-01-23 DIAGNOSIS — S4992XA Unspecified injury of left shoulder and upper arm, initial encounter: Secondary | ICD-10-CM | POA: Diagnosis present

## 2021-01-23 HISTORY — DX: Type 2 diabetes mellitus without complications: E11.9

## 2021-01-23 HISTORY — DX: Essential (primary) hypertension: I10

## 2021-01-23 HISTORY — DX: Unspecified asthma, uncomplicated: J45.909

## 2021-01-23 HISTORY — DX: Pure hypercholesterolemia, unspecified: E78.00

## 2021-01-23 LAB — CBC WITH DIFFERENTIAL/PLATELET
Abs Immature Granulocytes: 0.08 10*3/uL — ABNORMAL HIGH (ref 0.00–0.07)
Basophils Absolute: 0 10*3/uL (ref 0.0–0.1)
Basophils Relative: 0 %
Eosinophils Absolute: 0.1 10*3/uL (ref 0.0–0.5)
Eosinophils Relative: 1 %
HCT: 41.7 % (ref 39.0–52.0)
Hemoglobin: 13.9 g/dL (ref 13.0–17.0)
Immature Granulocytes: 1 %
Lymphocytes Relative: 32 %
Lymphs Abs: 2.4 10*3/uL (ref 0.7–4.0)
MCH: 29.4 pg (ref 26.0–34.0)
MCHC: 33.3 g/dL (ref 30.0–36.0)
MCV: 88.2 fL (ref 80.0–100.0)
Monocytes Absolute: 0.7 10*3/uL (ref 0.1–1.0)
Monocytes Relative: 9 %
Neutro Abs: 4.1 10*3/uL (ref 1.7–7.7)
Neutrophils Relative %: 57 %
Platelets: 298 10*3/uL (ref 150–400)
RBC: 4.73 MIL/uL (ref 4.22–5.81)
RDW: 13.8 % (ref 11.5–15.5)
WBC: 7.3 10*3/uL (ref 4.0–10.5)
nRBC: 0 % (ref 0.0–0.2)

## 2021-01-23 LAB — COMPREHENSIVE METABOLIC PANEL
ALT: 67 U/L — ABNORMAL HIGH (ref 0–44)
AST: 28 U/L (ref 15–41)
Albumin: 3.5 g/dL (ref 3.5–5.0)
Alkaline Phosphatase: 118 U/L (ref 38–126)
Anion gap: 6 (ref 5–15)
BUN: 11 mg/dL (ref 6–20)
CO2: 25 mmol/L (ref 22–32)
Calcium: 8.9 mg/dL (ref 8.9–10.3)
Chloride: 103 mmol/L (ref 98–111)
Creatinine, Ser: 0.83 mg/dL (ref 0.61–1.24)
GFR, Estimated: >60 mL/min (ref >60)
Glucose, Bld: 273 mg/dL — ABNORMAL HIGH (ref 70–99)
Potassium: 3.7 mmol/L (ref 3.5–5.1)
Sodium: 134 mmol/L — ABNORMAL LOW (ref 135–145)
Total Bilirubin: 0.7 mg/dL (ref 0.3–1.2)
Total Protein: 7.1 g/dL (ref 6.5–8.1)

## 2021-01-23 NOTE — ED Notes (Signed)
Pt denies pain or distress
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
## 2021-01-23 NOTE — ED Notes (Signed)
Patient transported to X-ray 
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
## 2021-01-23 NOTE — ED Notes (Signed)
C-collar applied in triage.

## 2021-01-24 LAB — CBG MONITORING, ED: Glucose-Capillary: 196 mg/dL — ABNORMAL HIGH (ref 70–99)

## 2021-01-24 MED ORDER — HYDROCODONE-ACETAMINOPHEN 5-325 MG PO TABS: 1.0000 | ORAL_TABLET | ORAL | 0 refills | Status: AC | PRN

## 2021-01-24 NOTE — Discharge Instructions (Addendum)
Apply ice for 30 minutes at a time, 4 times a day.  Take naproxen or ibuprofen as needed for pain.  For additional pain relief, add acetaminophen.  The combination of acetaminophen and either ibuprofen or naproxen will give you better pain relief than either medication by itself.

## 2021-01-24 NOTE — ED Notes (Signed)
Checked bloodsugar at 0123 .196mg 
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
## 2022-09-15 DEATH — deceased
